# Patient Record
Sex: Male | Born: 2000 | Race: Black or African American | Hispanic: No | Marital: Single | Smoking: Never smoker
Health system: Southern US, Community
[De-identification: ages and names within clinical notes are randomized; demographics above are authoritative.]

---
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
## 2023-03-18 ENCOUNTER — Ambulatory Visit
Admission: EM | Admit: 2023-03-18 | Discharge: 2023-03-18 | Disposition: A | Discharge status: Home / Self Care | Payer: Self-pay | Attending: Family Medicine | Admitting: Family Medicine

## 2023-03-18 DIAGNOSIS — Z202 Contact with and (suspected) exposure to infections with a predominantly sexual mode of transmission: Secondary | ICD-10-CM

## 2023-03-18 MED ORDER — DOXYCYCLINE HYCLATE 100 MG PO CAPS: 100.0000 mg | ORAL_CAPSULE | Freq: Two times a day (BID) | ORAL | 0 refills | Status: AC

## 2023-03-18 NOTE — Discharge Instructions (Signed)
 Start doxycycline twice daily for 7 days.  The clinic will contact you with results of the testing done today if positive.  Please follow-up with your PCP as needed

## 2023-03-18 NOTE — ED Provider Notes (Signed)
 [facility]    CSN: [id number] Arrival date & time: 03/18/23  1531      History   Chief Complaint Chief Complaint  Patient presents with   Exposure to STD    HPI Bradley Booth is a 23 y.o. male presents for STD screening.  Patient reports he has been exposed to chlamydia.  He currently denies any symptoms including penile discharge, dysuria, testicular pain or swelling, fevers or chills.  At this time.   Exposure to STD    History reviewed. No pertinent past medical history.  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 No pertinent surgical history.     Home Medications    Prior to Admission medications   Medication Sig Start Date End Date Taking? Authorizing Provider  doxycycline  (VIBRAMYCIN ) 100 MG capsule Take 1 capsule (100 mg total) by mouth 2 (two) times daily for 7 days. 03/18/23 03/25/23 Yes Bradley Booth, Bradley Booth  benzonatate  (TESSALON ) 100 MG capsule Take 1 capsule (100 mg total) by mouth every 8 (eight) hours. 01/23/21   Bradley Sluder, Bradley Booth  brompheniramine-pseudoephedrine-DM 30-2-10 MG/5ML syrup Take 5 mLs by mouth 4 (four) times daily as needed. 05/12/21   Bradley Delon HERO, Bradley Booth  ibuprofen  (ADVIL ) 400 MG tablet Take 1 tablet (400 mg total) by mouth every 6 (six) hours as needed. 10/17/18   Bradley Booth, Bradley Booth, Booth  predniSONE  (DELTASONE ) 20 MG tablet Take 2 tablets (40 mg total) by mouth daily with breakfast. 05/12/21   Bradley Delon HERO, Bradley Booth    Family History Family History  Problem Relation Age of Onset   Learning disabilities Brother     Social History Social History   Tobacco Use   Smoking status: Never    Passive exposure: Yes   Smokeless tobacco: Never     Allergies   Patient has no known allergies.   Review of Systems Review of Systems  Genitourinary:        STD screening      Physical Exam Triage Vital Signs ED Triage Vitals  Encounter Vitals Group     BP 03/18/23 1631 135/77     Systolic BP Percentile --      Diastolic BP Percentile --      Pulse Rate 03/18/23 1631 72     Resp 03/18/23 1631 18     Temp 03/18/23 1631 97.6 F (36.4 C)     Temp Source 03/18/23 1631 Oral     SpO2 03/18/23 1631 98 %     Weight --      Height --      Head Circumference --      Peak Flow --      Pain Score 03/18/23 1630 0     Pain Loc --      Pain Education --      Exclude from Growth Chart --    No data found.  Updated Vital Signs BP 135/77 (BP Location: Right Arm)   Pulse 72   Temp 97.6 F (36.4 C) (Oral)   Resp 18   SpO2 98%   Visual Acuity Right Eye Distance:   Left Eye Distance:   Bilateral Distance:    Right Eye Near:   Left Eye Near:    Bilateral Near:     Physical Exam Vitals and nursing note reviewed.  Constitutional:  Appearance: Normal appearance.  HENT:     Head: Normocephalic and atraumatic.  Eyes:     Pupils: Pupils are equal, round, and reactive to light.  Cardiovascular:     Rate and Rhythm: Normal rate.  Pulmonary:     Effort: Pulmonary effort is normal.  Abdominal:     Tenderness: There is no right CVA tenderness or left CVA tenderness.  Skin:    General: Skin is warm and dry.  Neurological:     General: No focal deficit present.     Mental Status: He is alert and oriented to person, place, and time.  Psychiatric:        Mood and Affect: Mood normal.        Behavior: Behavior normal.      UC Treatments / Results  Labs (all labs ordered are listed, but only abnormal results are displayed) Labs Reviewed  CYTOLOGY, (ORAL, ANAL, URETHRAL) ANCILLARY ONLY    EKG   Radiology No results found.  Procedures Procedures (including critical care time)  Medications Ordered in UC Medications - No data to display  Initial Impression / Assessment and Plan / UC Course  I have reviewed the triage vital signs and  the nursing notes.  Pertinent labs & imaging results that were available during my care of the patient were reviewed by me and considered in my medical decision making (see chart for details).     STD testing is ordered and will contact for any positive results.  Given exposure to chlamydia will start doxycycline .  Follow-up with PCP as needed Final Clinical Impressions(s) / UC Diagnoses   Final diagnoses:  Exposure to chlamydia     Discharge Instructions      Start doxycycline  twice daily for 7 days.  The clinic will contact you with results of the testing done today if positive.  Please follow-up with your PCP as needed   ED Prescriptions     Medication Sig Dispense Auth. Provider   doxycycline  (VIBRAMYCIN ) 100 MG capsule Take 1 capsule (100 mg total) by mouth 2 (two) times daily for 7 days. 14 capsule Bradley Booth, Bradley Booth      PDMP not reviewed this encounter.   Bradley Booth SAUNDERS, Booth 03/18/23 [phone]

## 2023-03-18 NOTE — ED Triage Notes (Signed)
 Pt presents for STD testing. Pt states he had a recent partner who is positive for Chlamydia.   Pt denies sxs.

## 2023-03-19 LAB — CYTOLOGY, (ORAL, ANAL, URETHRAL) ANCILLARY ONLY
Chlamydia: NEGATIVE
Comment: NEGATIVE
Comment: NEGATIVE
Comment: NORMAL
Neisseria Gonorrhea: NEGATIVE
Trichomonas: NEGATIVE

## 2024-03-21 ENCOUNTER — Encounter (HOSPITAL_BASED_OUTPATIENT_CLINIC_OR_DEPARTMENT_OTHER): Payer: Self-pay | Admitting: Emergency Medicine

## 2024-03-21 ENCOUNTER — Emergency Department (HOSPITAL_BASED_OUTPATIENT_CLINIC_OR_DEPARTMENT_OTHER)
Admission: EM | Admit: 2024-03-21 | Discharge: 2024-03-21 | Disposition: A | Discharge status: Home / Self Care | Payer: Self-pay | Attending: Emergency Medicine | Admitting: Emergency Medicine

## 2024-03-21 DIAGNOSIS — S025XXA Fracture of tooth (traumatic), initial encounter for closed fracture: Secondary | ICD-10-CM | POA: Insufficient documentation

## 2024-03-21 DIAGNOSIS — X58XXXA Exposure to other specified factors, initial encounter: Secondary | ICD-10-CM | POA: Insufficient documentation

## 2024-03-21 DIAGNOSIS — K029 Dental caries, unspecified: Secondary | ICD-10-CM | POA: Insufficient documentation

## 2024-03-21 MED ORDER — PENICILLIN V POTASSIUM 500 MG PO TABS: 500.0000 mg | ORAL_TABLET | Freq: Four times a day (QID) | ORAL | 0 refills | Status: AC

## 2024-03-21 MED ORDER — IBUPROFEN 800 MG PO TABS
800.0000 mg | ORAL_TABLET | Freq: Once | ORAL | Status: AC
  Administered 2024-03-21: 800 mg via ORAL
  Filled 2024-03-21: qty 1

## 2024-03-21 NOTE — ED Notes (Signed)
 Reviewed AVS/discharge instruction with patient. Time allotted for and all questions answered. Patient is agreeable for d/c and escorted to ed exit by staff.

## 2024-03-21 NOTE — ED Triage Notes (Signed)
 Right bottom dental pain x 2 days

## 2024-03-21 NOTE — ED Provider Notes (Signed)
 " [place] [facility] AT [facility] Provider Note   CSN: [id number] Arrival date & time: 03/21/24  1247     Patient presents with: Dental Pain   Bradley Booth is a 24 y.o. male.   24 yo male with right lower dental pain x 2 days. States tooth has been going bad but bit into a sucker and now with worsening pain. No trauma, fever, drainage.        Prior to Admission medications  Medication Sig Start Date End Date Taking? Authorizing Provider  penicillin  v potassium (VEETID) 500 MG tablet Take 1 tablet (500 mg total) by mouth 4 (four) times daily for 7 days. 03/21/24 03/28/24 Yes Chancy Leita LABOR, PA-C  benzonatate  (TESSALON ) 100 MG capsule Take 1 capsule (100 mg total) by mouth every 8 (eight) hours. 01/23/21   Emelia Sluder, PA-C  brompheniramine-pseudoephedrine-DM 30-2-10 MG/5ML syrup Take 5 mLs by mouth 4 (four) times daily as needed. 05/12/21   [name], PA-C  ibuprofen  (ADVIL ) 400 MG tablet Take 1 tablet (400 mg total) by mouth every 6 (six) hours as needed. 10/17/18   Burky, Natalie B, NP  predniSONE  (DELTASONE ) 20 MG tablet Take 2 tablets (40 mg total) by mouth daily with breakfast. 05/12/21   Burnette, Jennifer M, PA-C    Allergies: Patient has no known allergies.    Review of Systems Negative except as per HPI Updated Vital Signs BP 132/76 (BP Location: Right Arm)   Pulse 73   Temp (P) 98.5 F (36.9 C) (Oral)   Resp 16   SpO2 100%   Physical Exam Vitals and nursing note reviewed.  Constitutional:      General: He is not in acute distress.    Appearance: He is well-developed. He is not diaphoretic.  HENT:     Head: Normocephalic and atraumatic.     Jaw: No trismus.     Nose: Nose normal.     Mouth/Throat:     Mouth: Mucous membranes are moist.     Dentition: Abnormal dentition. Does not have dentures. Dental tenderness and dental caries present. No gingival swelling, dental abscesses or gum lesions.      Comments: Broken molar right  lower  Pulmonary:     Effort: Pulmonary effort is normal.  Neurological:     Mental Status: He is alert and oriented to person, place, and time.  Psychiatric:        Behavior: Behavior normal.     (all labs ordered are listed, but only abnormal results are displayed) Labs Reviewed - No data to display  EKG: None  Radiology: No results found.   Procedures   Medications Ordered in the ED - No data to display                                  Medical Decision Making Risk Prescription drug management.   24 yo male with right lower dental pain, tooth broken after eating candy 2 days ago. No drainage, no abscess, no sublingual swelling. Dc with abx and dental referral guide.      Final diagnoses:  Closed fracture of tooth, initial encounter    ED Discharge Orders          Ordered    penicillin  v potassium (VEETID) 500 MG tablet  4 times daily        03/21/24 1309  [place], [place], PA-C 03/21/24 1312  "

## 2024-03-21 NOTE — Discharge Instructions (Signed)
 See a dentist as soon as possible. Take antibiotics as prescribed and complete the full course. Take 600mg  Advil  Liquid Gel with 650mg  Tylenol every 6 hours as needed for pain.
